# Patient Record
Sex: Male | Born: 1980 | Race: White | Hispanic: No | Marital: Single | Smoking: Current every day smoker
Health system: Southern US, Community
[De-identification: ages and names within clinical notes are randomized; demographics above are authoritative.]

---

## 2005-02-11 ENCOUNTER — Emergency Department (HOSPITAL_COMMUNITY): Admission: EM | Admit: 2005-02-11 | Discharge: 2005-02-11 | Payer: Self-pay | Admitting: Emergency Medicine

## 2005-02-11 ENCOUNTER — Inpatient Hospital Stay (HOSPITAL_COMMUNITY): Admission: EM | Admit: 2005-02-11 | Discharge: 2005-02-15 | Payer: Self-pay | Admitting: Psychiatry

## 2005-02-12 ENCOUNTER — Ambulatory Visit: Payer: Self-pay | Admitting: Psychiatry

## 2018-08-07 ENCOUNTER — Other Ambulatory Visit: Payer: Self-pay

## 2018-08-07 ENCOUNTER — Emergency Department
Admission: EM | Admit: 2018-08-07 | Discharge: 2018-08-07 | Disposition: A | Discharge status: Home / Self Care | Payer: Self-pay | Attending: Emergency Medicine | Admitting: Emergency Medicine

## 2018-08-07 ENCOUNTER — Emergency Department: Payer: Self-pay

## 2018-08-07 ENCOUNTER — Encounter: Payer: Self-pay | Admitting: *Deleted

## 2018-08-07 DIAGNOSIS — J40 Bronchitis, not specified as acute or chronic: Secondary | ICD-10-CM | POA: Insufficient documentation

## 2018-08-07 DIAGNOSIS — R0981 Nasal congestion: Secondary | ICD-10-CM | POA: Insufficient documentation

## 2018-08-07 DIAGNOSIS — F17200 Nicotine dependence, unspecified, uncomplicated: Secondary | ICD-10-CM | POA: Insufficient documentation

## 2018-08-07 DIAGNOSIS — R0989 Other specified symptoms and signs involving the circulatory and respiratory systems: Secondary | ICD-10-CM | POA: Insufficient documentation

## 2018-08-07 MED ORDER — PREDNISONE 20 MG PO TABS: 40.0000 mg | ORAL_TABLET | Freq: Every day | ORAL | 0 refills | Status: AC

## 2018-08-07 MED ORDER — BENZONATATE 100 MG PO CAPS: 100.0000 mg | ORAL_CAPSULE | Freq: Four times a day (QID) | ORAL | 0 refills | Status: AC | PRN

## 2018-08-07 MED ORDER — ALBUTEROL SULFATE HFA 108 (90 BASE) MCG/ACT IN AERS: 2.0000 | INHALATION_SPRAY | Freq: Four times a day (QID) | RESPIRATORY_TRACT | 0 refills | Status: AC | PRN

## 2018-08-07 NOTE — ED Triage Notes (Signed)
Pt has cough, runny nose and body aches for 1 week   Pt alert.

## 2018-08-07 NOTE — Discharge Instructions (Addendum)
Please take medications as prescribed as needed.  Make sure you are drinking lots of fluids.  Return to the ER for any shortness of breath fevers worsening symptoms or urgent changes in your health.

## 2018-08-07 NOTE — ED Notes (Signed)
Patient states has been coughing for about a week and has been taking tylenol sever cold and cough. Patient has been having fever but unable to check, night sweats, cold chills and body ache.

## 2018-08-07 NOTE — ED Provider Notes (Signed)
[facility] EMERGENCY DEPARTMENT Provider Note   CSN: [id number] Arrival date & time: 08/07/18  2147    History   Chief Complaint Chief Complaint  Patient presents with  . Cough    HPI Austin Giles is a 38 y.o. male.  Presents to the emergency department for evaluation of cough congestion runny nose.  Patient's had symptoms for 1 week.  Patient states his cough is increased over the last couple of days.  He denies any headaches, body aches, fevers but has had some chills over the last couple days.  No abdominal pain nausea vomiting or diarrhea.  He has taken Tylenol cough and cold medication with little to no relief.  He is tolerating p.o. well.  Denies any sore throat or rashes.     HPI  No past medical history on file.  There are no active problems to display for this patient.         Home Medications    Prior to Admission medications   Medication Sig Start Date End Date Taking? Authorizing Provider  albuterol (PROVENTIL HFA;VENTOLIN HFA) 108 (90 Base) MCG/ACT inhaler Inhale 2 puffs into the lungs every 6 (six) hours as needed for wheezing or shortness of breath. 08/07/18   Evon Slack, PA-C  benzonatate (TESSALON PERLES) 100 MG capsule Take 1 capsule (100 mg total) by mouth every 6 (six) hours as needed for cough. 08/07/18 08/07/19  Evon Slack, PA-C  predniSONE (DELTASONE) 20 MG tablet Take 2 tablets (40 mg total) by mouth daily. 08/07/18   Evon Slack, PA-C    Family History No family history on file.  Social History Social History   Tobacco Use  . Smoking status: Current Every Day Smoker  . Smokeless tobacco: Never Used  Substance Use Topics  . Alcohol use: Not Currently  . Drug use: Not Currently     Allergies   Patient has no known allergies.   Review of Systems Review of Systems  Constitutional: Positive for chills. Negative for fever.  HENT: Positive for congestion, rhinorrhea and sore throat. Negative for  sinus pressure and sinus pain.   Respiratory: Positive for cough. Negative for wheezing and stridor.   Gastrointestinal: Negative for diarrhea, nausea and vomiting.  Musculoskeletal: Negative for arthralgias, myalgias and neck stiffness.  Skin: Negative for rash.  Neurological: Negative for dizziness.     Physical Exam Updated Vital Signs BP 140/83 (BP Location: Left Arm)   Pulse 75   Temp 98 F (36.7 C) (Oral)   Resp 18   Ht 6\' 2"  (1.88 m)   Wt 72.6 kg   SpO2 98%   BMI 20.54 kg/m   Physical Exam Vitals signs reviewed.  Constitutional:      General: He is not in acute distress.    Appearance: He is well-developed.  HENT:     Head: Normocephalic and atraumatic.     Jaw: No trismus.     Right Ear: Hearing, tympanic membrane, ear canal and external ear normal.     Left Ear: Hearing, tympanic membrane, ear canal and external ear normal.     Nose: Rhinorrhea present.     Right Sinus: No maxillary sinus tenderness or frontal sinus tenderness.     Left Sinus: No maxillary sinus tenderness or frontal sinus tenderness.     Mouth/Throat:     Pharynx: No oropharyngeal exudate, posterior oropharyngeal erythema or uvula swelling.     Tonsils: No tonsillar abscesses.  Eyes:     Conjunctiva/sclera:  Conjunctivae normal.  Neck:     Musculoskeletal: Normal range of motion. No neck rigidity or muscular tenderness.  Cardiovascular:     Rate and Rhythm: Normal rate and regular rhythm.     Pulses: Normal pulses.  Pulmonary:     Effort: Pulmonary effort is normal. No respiratory distress.     Breath sounds: Normal breath sounds. No stridor. No wheezing.  Chest:     Chest wall: No tenderness.  Abdominal:     General: There is no distension.     Palpations: Abdomen is soft.     Tenderness: There is no abdominal tenderness. There is no guarding.  Musculoskeletal: Normal range of motion.  Skin:    General: Skin is warm and dry.     Findings: No rash.  Neurological:     Mental Status:  He is alert and oriented to person, place, and time.  Psychiatric:        Behavior: Behavior normal.        Thought Content: Thought content normal.        Judgment: Judgment normal.      ED Treatments / Results  Labs (all labs ordered are listed, but only abnormal results are displayed) Labs Reviewed - No data to display  EKG None  Radiology Dg Chest 2 View  Result Date: 08/07/2018 CLINICAL DATA:  Cough, runny nose, body aches EXAM: CHEST - 2 VIEW COMPARISON:  None. FINDINGS: Heart and mediastinal contours are within normal limits. No focal opacities or effusions. No acute bony abnormality. IMPRESSION: No active cardiopulmonary disease. Electronically Signed   By: Charlett Nose M.D.   On: 08/07/2018 22:22    Procedures Procedures (including critical care time)  Medications Ordered in ED Medications - No data to display   Initial Impression / Assessment and Plan / ED Course  I have reviewed the triage vital signs and the nursing notes.  Pertinent labs & imaging results that were available during my care of the patient were reviewed by me and considered in my medical decision making (see chart for details).        38 year old male with bronchitis.  Vital signs are stable, afebrile.  No chest pain or shortness of breath.  Chest x-ray ordered and reviewed by me today show no acute cardiopulmonary process.  Patient will be given a prescription for Tessalon Perles, prednisone, albuterol.  He understands signs symptoms return to ED for  Final Clinical Impressions(s) / ED Diagnoses   Final diagnoses:  Bronchitis    ED Discharge Orders         Ordered    predniSONE (DELTASONE) 20 MG tablet  Daily     08/07/18 2245    albuterol (PROVENTIL HFA;VENTOLIN HFA) 108 (90 Base) MCG/ACT inhaler  Every 6 hours PRN     08/07/18 2245    benzonatate (TESSALON PERLES) 100 MG capsule  Every 6 hours PRN     08/07/18 2245           Ronnette Juniper 08/07/18 2247     Minna Antis, MD 08/07/18 2342

## 2020-08-26 IMAGING — CR CHEST - 2 VIEW
1 series · 2 of 2 positions shown · non-contrast
Comparison: None.

CLINICAL DATA: Cough, runny nose, body aches

EXAM:
CHEST - 2 VIEW

[Series 1: dg chest 2 view · 0.14mm/px · 2 of 2 slices shown]
[im 1/2]
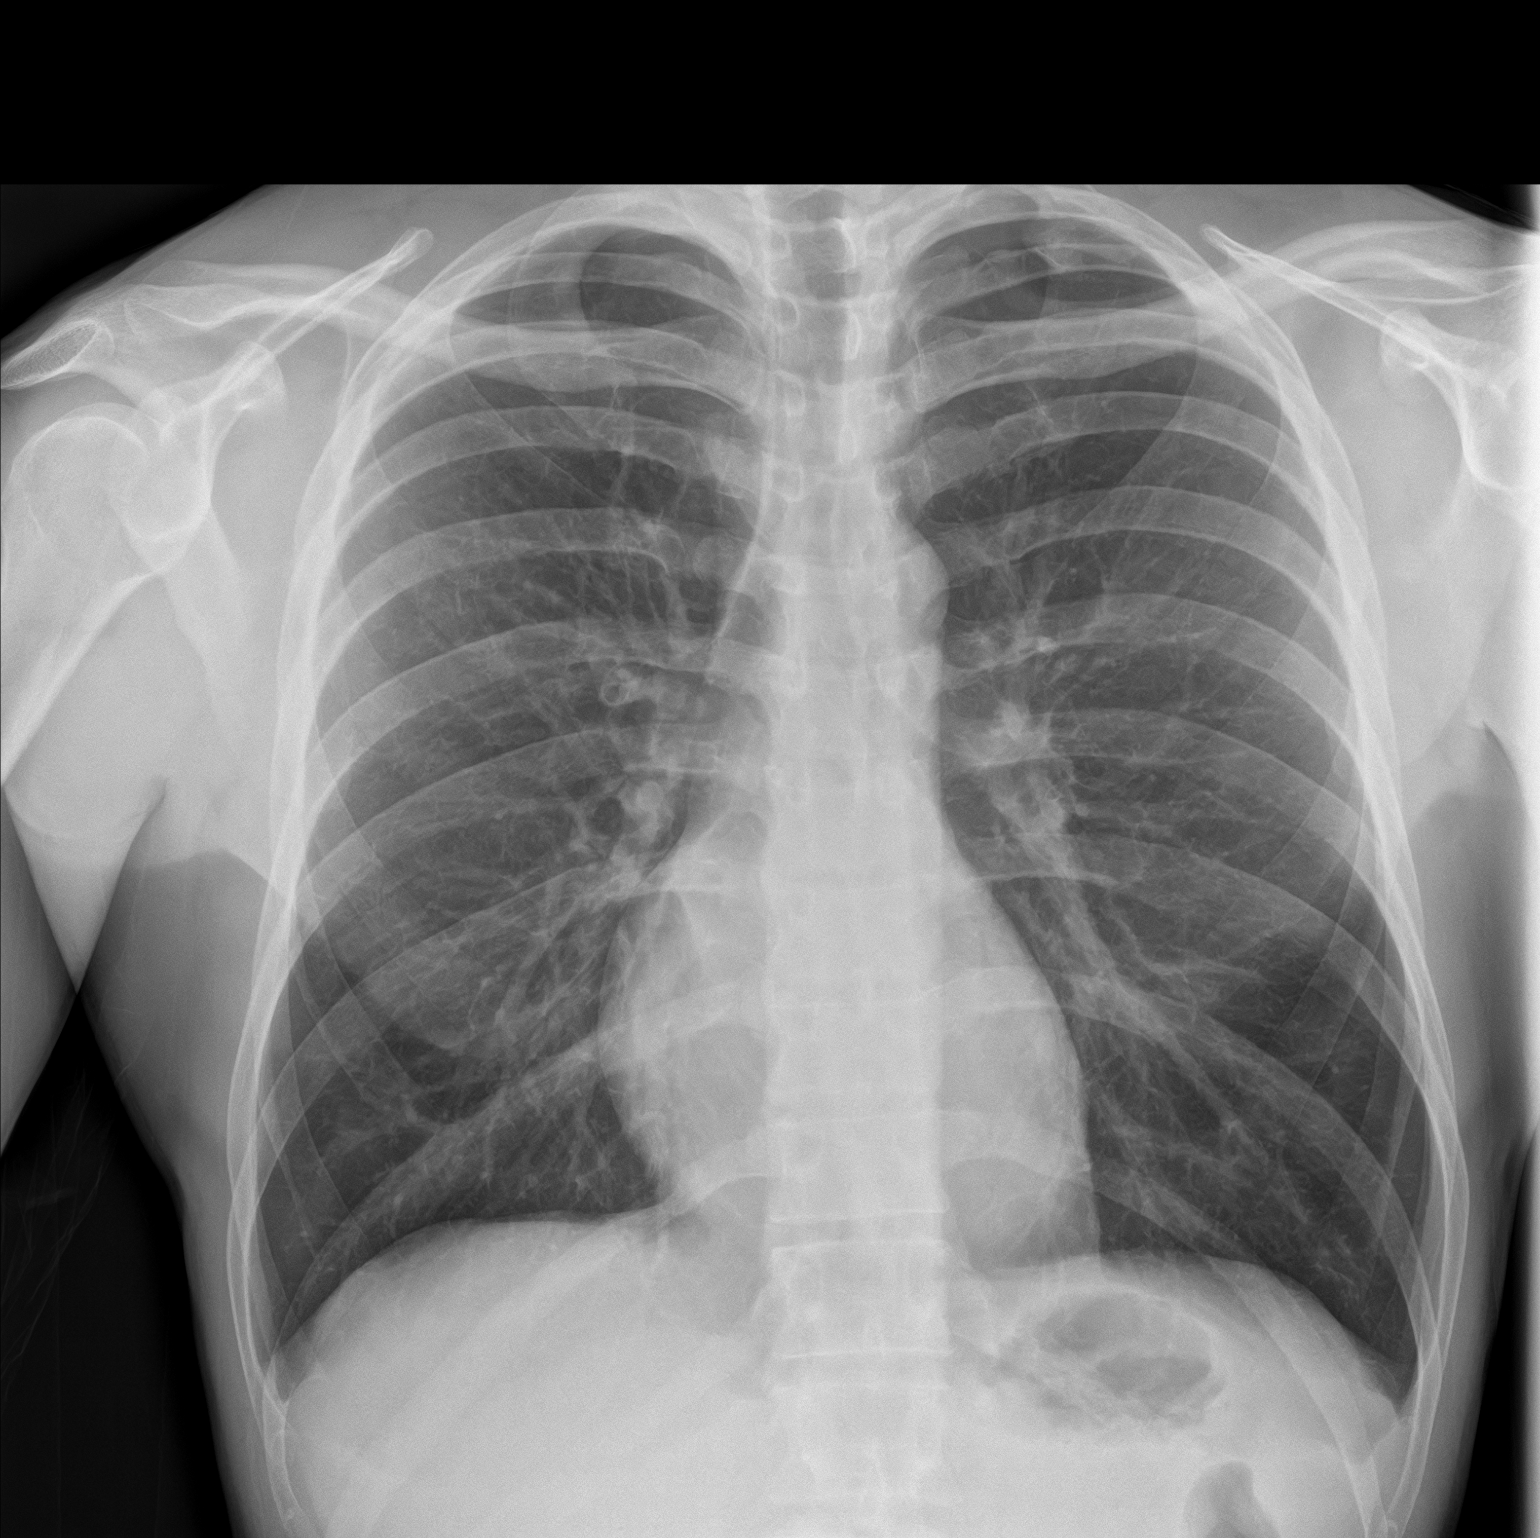
[im 2/2]
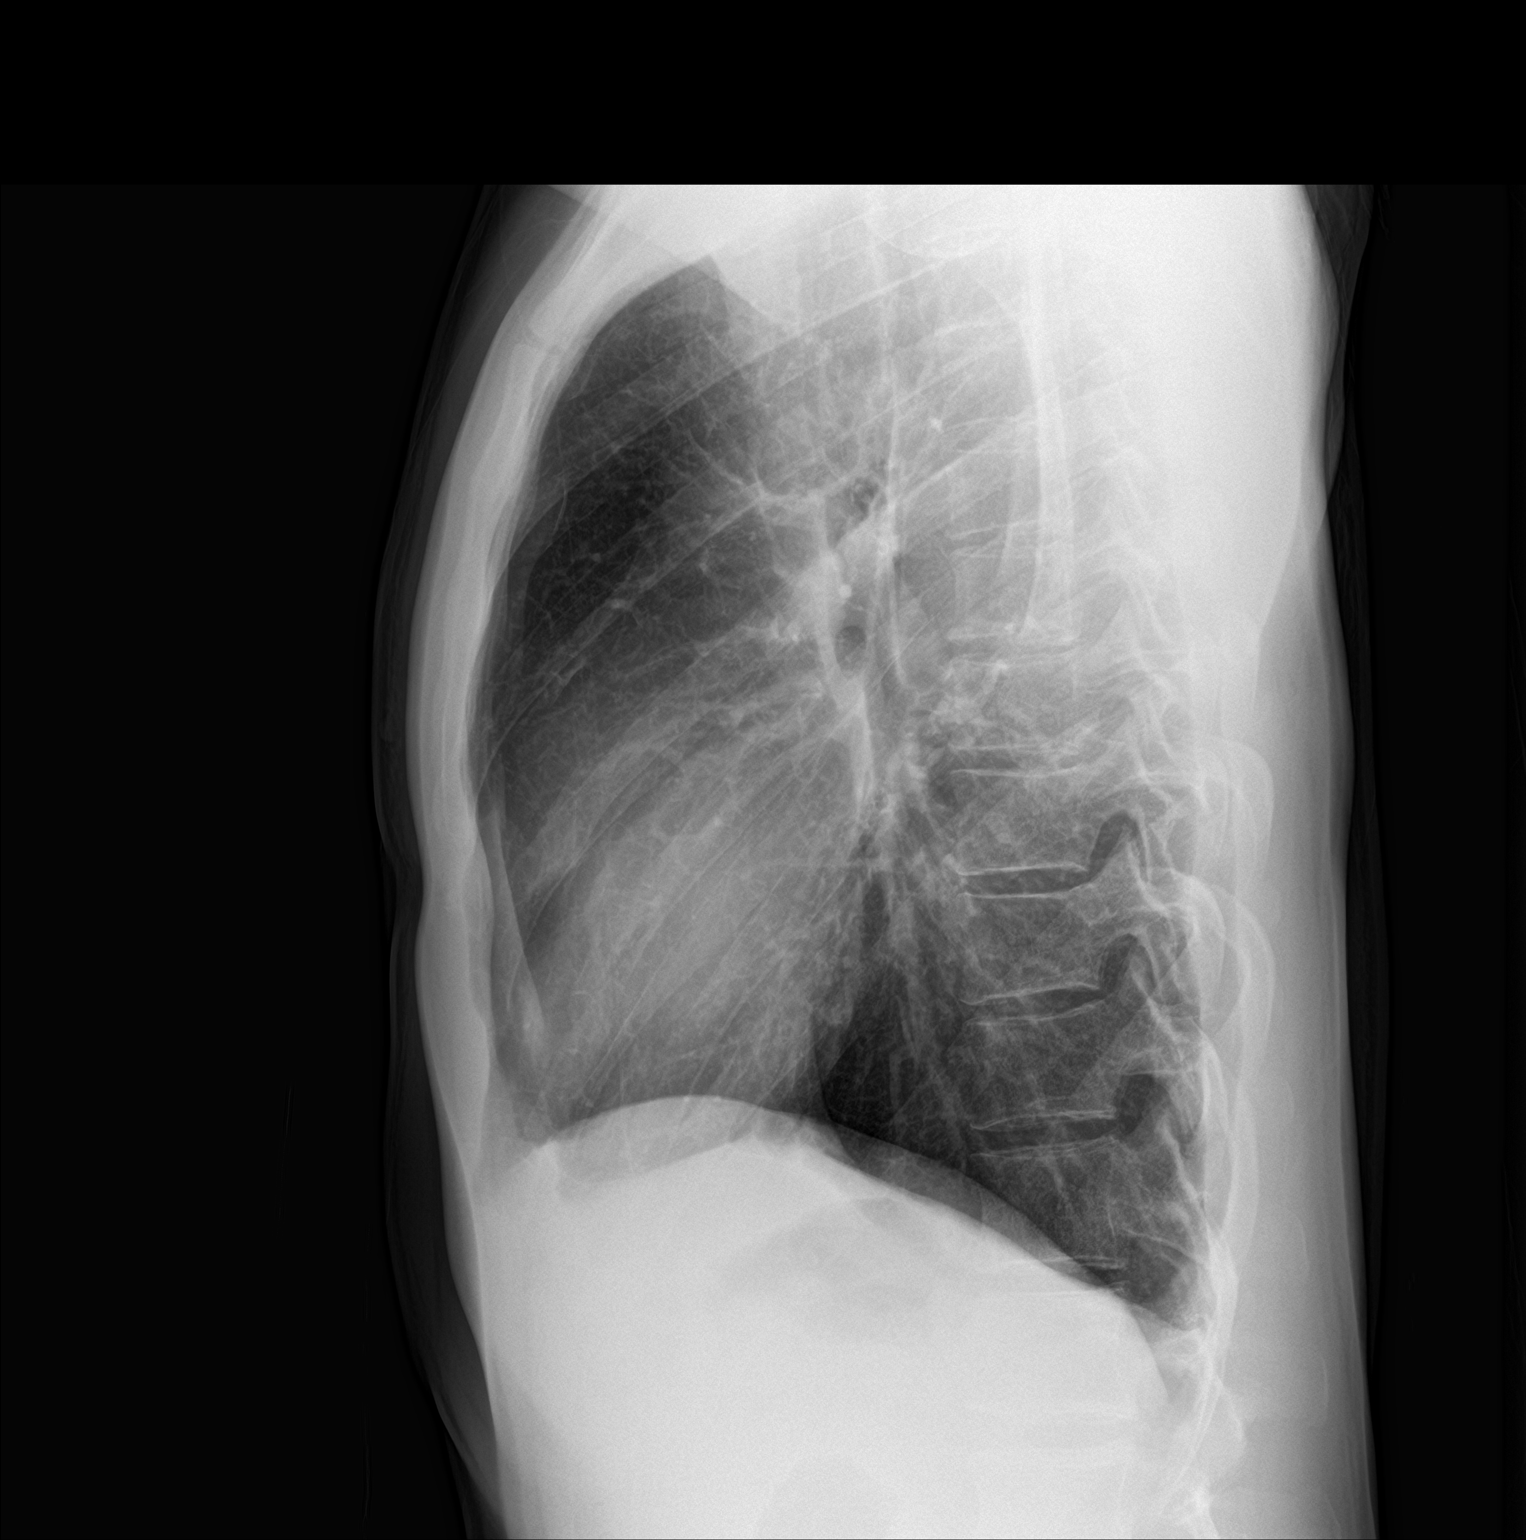

[2 of 2 positions shown; findings below may reference images not displayed]

FINDINGS: Heart and mediastinal contours are within normal limits. No focal
opacities or effusions. No acute bony abnormality.
IMPRESSION: No active cardiopulmonary disease.
